# Patient Record
Sex: Female | Born: 1960 | Race: White | Hispanic: No | Marital: Married | Smoking: Former smoker
Health system: Southern US, Community
[De-identification: ages and names within clinical notes are randomized; demographics above are authoritative.]

## PROBLEM LIST (undated history)

## (undated) DIAGNOSIS — Z9889 Other specified postprocedural states: Secondary | ICD-10-CM

## (undated) DIAGNOSIS — T4145XA Adverse effect of unspecified anesthetic, initial encounter: Secondary | ICD-10-CM

## (undated) DIAGNOSIS — T8859XA Other complications of anesthesia, initial encounter: Secondary | ICD-10-CM

## (undated) DIAGNOSIS — R112 Nausea with vomiting, unspecified: Secondary | ICD-10-CM

## (undated) DIAGNOSIS — H8109 Meniere's disease, unspecified ear: Secondary | ICD-10-CM

## (undated) HISTORY — PX: ENDOMETRIAL BIOPSY: SHX622

## (undated) HISTORY — PX: WISDOM TOOTH EXTRACTION: SHX21

## (undated) HISTORY — PX: ABLATION ON ENDOMETRIOSIS: SHX5787

## (undated) HISTORY — PX: RECTO-VAGINAL FISSURE REPAIR: SHX5277

## (undated) HISTORY — PX: KNEE ARTHROSCOPY: SUR90

---

## 2001-01-18 ENCOUNTER — Other Ambulatory Visit: Admission: RE | Admit: 2001-01-18 | Discharge: 2001-01-18 | Payer: Self-pay | Admitting: Obstetrics & Gynecology

## 2003-02-15 ENCOUNTER — Other Ambulatory Visit: Admission: RE | Admit: 2003-02-15 | Discharge: 2003-02-15 | Payer: Self-pay | Admitting: Obstetrics & Gynecology

## 2004-05-06 ENCOUNTER — Other Ambulatory Visit: Admission: RE | Admit: 2004-05-06 | Discharge: 2004-05-06 | Payer: Self-pay | Admitting: Obstetrics & Gynecology

## 2005-07-22 ENCOUNTER — Other Ambulatory Visit: Admission: RE | Admit: 2005-07-22 | Discharge: 2005-07-22 | Payer: Self-pay | Admitting: Obstetrics & Gynecology

## 2010-06-15 ENCOUNTER — Encounter: Payer: Self-pay | Admitting: Obstetrics & Gynecology

## 2014-05-01 ENCOUNTER — Other Ambulatory Visit: Payer: Self-pay | Admitting: Obstetrics & Gynecology

## 2014-05-01 DIAGNOSIS — N63 Unspecified lump in unspecified breast: Secondary | ICD-10-CM

## 2014-05-09 ENCOUNTER — Ambulatory Visit
Admission: RE | Admit: 2014-05-09 | Discharge: 2014-05-09 | Disposition: A | Discharge status: Home / Self Care | Payer: 59 | Source: Ambulatory Visit | Attending: Obstetrics & Gynecology | Admitting: Obstetrics & Gynecology

## 2014-05-09 DIAGNOSIS — N63 Unspecified lump in unspecified breast: Secondary | ICD-10-CM

## 2015-04-24 ENCOUNTER — Encounter: Payer: Self-pay | Admitting: Family Medicine

## 2015-04-24 ENCOUNTER — Ambulatory Visit (INDEPENDENT_AMBULATORY_CARE_PROVIDER_SITE_OTHER): Payer: 59 | Admitting: Family Medicine

## 2015-04-24 VITALS — BP 128/81 | HR 53 | Temp 97.0°F | Ht 65.0 in | Wt 136.8 lb

## 2015-04-24 DIAGNOSIS — H6502 Acute serous otitis media, left ear: Secondary | ICD-10-CM

## 2015-04-24 NOTE — Progress Notes (Signed)
   Subjective:    Patient ID: Penny Bennett, female    DOB: [birth year], 54 y.o.   MRN: [id number]  HPI 54 year old female who complains of some fullness in her left ear. Onset was fairly sudden. There is no been no pain or fever.  There are no active problems to display for this patient.  Outpatient Encounter Prescriptions as of 04/24/2015  Medication Sig  . calcium citrate-vitamin D (CITRACAL+D) 315-200 MG-UNIT tablet Take 1 tablet by mouth 2 (two) times daily.  . Flaxseed, Linseed, (FLAX SEED OIL) 1000 MG CAPS Take 1 capsule by mouth daily.  Marland Kitchen. zolpidem (AMBIEN) 5 MG tablet Take 5 mg by mouth at bedtime as needed for sleep.   No facility-administered encounter medications on file as of 04/24/2015.      Review of Systems  Constitutional: Negative.   HENT: Positive for hearing loss.   Respiratory: Negative.   Cardiovascular: Negative.   Neurological: Negative.   Psychiatric/Behavioral: Negative.        Objective:   Physical Exam  Constitutional: She appears well-developed and well-nourished.  HENT:  Head: Normocephalic.  Left external canal is clear but the tympanic membrane is dull and there appeared to be some fluid behind eardrum. With Valsalva maneuver the eardrum does not move or pop  Cardiovascular: Normal rate and regular rhythm.   Pulmonary/Chest: Effort normal and breath sounds normal.          Assessment & Plan:  1. Acute serous otitis media of left ear, recurrence not specified No evidence of infection. Patient to use Afrin and Flonase along with Valsalva maneuver. Call or return if she develops pain.  Frederica KusterStephen M Inge Waldroup MD

## 2015-04-26 ENCOUNTER — Telehealth: Payer: Self-pay | Admitting: Family Medicine

## 2015-04-26 DIAGNOSIS — H6690 Otitis media, unspecified, unspecified ear: Secondary | ICD-10-CM

## 2015-04-26 MED ORDER — AMOXICILLIN 500 MG PO CAPS: 500.0000 mg | ORAL_CAPSULE | Freq: Two times a day (BID) | ORAL | Status: DC

## 2015-04-26 NOTE — Telephone Encounter (Signed)
Please let p tknow--sent in to [facility] [place] 500mg  take twice a day for ten days for ear infection/acute otitis media.

## 2015-04-26 NOTE — Telephone Encounter (Signed)
What symptoms is she experiencing now? Pain and fever or no improvement in fullness? Left message on work voicemail to call back with these answers. She can provide these to the switchboard operator unless she needs to speak with me directly.

## 2015-04-26 NOTE — Telephone Encounter (Signed)
Patient called back and told the switchboard that her ear is now hurting and she has a low grade fever. Dr Hyacinth MeekerMiller had asked her to call back if she developed pain and that he would send in an antibiotic.  Will you review since he is out of the office today?

## 2015-04-26 NOTE — Telephone Encounter (Signed)
Left message on voicemail that medication sent in to pharmacy.

## 2015-08-09 ENCOUNTER — Other Ambulatory Visit: Payer: Self-pay | Admitting: Otolaryngology

## 2015-08-09 DIAGNOSIS — H8102 Meniere's disease, left ear: Secondary | ICD-10-CM

## 2015-08-23 ENCOUNTER — Ambulatory Visit
Admission: RE | Admit: 2015-08-23 | Discharge: 2015-08-23 | Disposition: A | Discharge status: Home / Self Care | Payer: 59 | Source: Ambulatory Visit | Attending: Otolaryngology | Admitting: Otolaryngology

## 2015-08-23 DIAGNOSIS — H8102 Meniere's disease, left ear: Secondary | ICD-10-CM

## 2015-08-23 MED ORDER — GADOBENATE DIMEGLUMINE 529 MG/ML IV SOLN
11.0000 mL | Freq: Once | INTRAVENOUS | Status: AC | PRN
  Administered 2015-08-23: 11 mL via INTRAVENOUS

## 2016-08-07 ENCOUNTER — Encounter (INDEPENDENT_AMBULATORY_CARE_PROVIDER_SITE_OTHER): Payer: Self-pay | Admitting: Orthopaedic Surgery

## 2016-08-07 ENCOUNTER — Ambulatory Visit (INDEPENDENT_AMBULATORY_CARE_PROVIDER_SITE_OTHER): Payer: 59 | Admitting: Orthopaedic Surgery

## 2016-08-07 ENCOUNTER — Ambulatory Visit (INDEPENDENT_AMBULATORY_CARE_PROVIDER_SITE_OTHER): Payer: 59

## 2016-08-07 VITALS — BP 146/89 | HR 65 | Ht 65.0 in | Wt 140.0 lb

## 2016-08-07 DIAGNOSIS — G5761 Lesion of plantar nerve, right lower limb: Secondary | ICD-10-CM | POA: Diagnosis not present

## 2016-08-07 DIAGNOSIS — M79671 Pain in right foot: Secondary | ICD-10-CM | POA: Diagnosis not present

## 2016-08-07 NOTE — Progress Notes (Addendum)
Office Visit Note   Patient: Penny Bennett           Date of Birth: July 02, 1960           MRN: [id number] Visit Date: 08/07/2016              Requested by: No referring provider defined for this encounter. PCP: No PCP Per Patient   Assessment & Plan: Visit Diagnoses:  1. Pain in right foot   2. Morton's neuroma of right foot     Plan: Injection  performed right foot third fourth interspace common digital nerve neuroma injection. Post injection she got improvement in her pain and can walk better. She continues to have tenderness between the fourth and fifth web space likely may have neuroma there as well and she can return in one week for repeat exam.  Follow-Up Instructions: Return in about 1 week (around 08/14/2016).   Orders:  Orders Placed This Encounter  Procedures  . Foot Injection  . XR Foot Complete Right   No orders of the defined types were placed in this encounter.     Procedures: Foot Inj Date/Time: 08/07/2016 3:01 PM Performed by: Penny Bennett, Penny Dethlefs Bennett Authorized by: Penny Bennett, Penny Bennett   Indications:  Neuroma Condition: Morton's Neuroma   Needle Size:  25 G Approach:  Dorsal Medications:  0.5 mL lidocaine 1 %; 13.33 mg methylPREDNISolone acetate 40 MG/ML Patient Tolerance:  Patient tolerated the procedure well with no immediate complications     Clinical Data: No additional findings.   Subjective: Chief Complaint  Patient presents with  . Right Foot - Pain    Patient presents with right foot pain and swelling x 6 months-1 year, but getting worse over the last 3 weeks. She states that the bottom of her foot gets numb. She has no known specific injury, but is a runner and Insurance account managerdancer/instructor. She tries to rest it as much as possible for relief.     Review of Systems  Constitutional: Negative for chills and diaphoresis.  HENT: Negative for ear discharge, ear pain and nosebleeds.   Eyes: Negative for discharge and visual disturbance.  Respiratory: Negative for  cough, choking and shortness of breath.   Cardiovascular: Negative for chest pain and palpitations.  Gastrointestinal: Negative for abdominal distention and abdominal pain.  Endocrine: Negative for cold intolerance and heat intolerance.  Genitourinary: Negative for flank pain and hematuria.  Musculoskeletal:       Negative back pain. She wearing pain with heels. She gets relief with nonweightbearing. Burning pain in the toes with ambulation. Right foot only. Primarily third and fourth interspace less so fourth fifth interspace.  Skin: Negative for rash and wound.  Neurological: Negative for seizures and speech difficulty.  Hematological: Negative for adenopathy. Does not bruise/bleed easily.  Psychiatric/Behavioral: Negative for agitation and suicidal ideas.   patient has been in excellent health active and runs multiple times a week as well as shag dancing. Increased pain 3 weeks. Patient was a past smoker quit 20 years ago. Previous retrograde vaginal fistula repair in 1989 endometriosis surgery 89 with some teeth extraction 95. Knee arthroscopy meniscal repair 2002. Positive for Mnire's disease.  Objective: Vital Signs: BP (!) 146/89   Pulse 65   Ht 5\' 5"  (1.651 m)   Wt 140 lb (63.5 kg)   BMI 23.30 kg/m   Physical Exam  Constitutional: She is oriented to person, place, and time. She appears well-developed.  HENT:  Head: Normocephalic.  Right Ear: External ear normal.  Left Ear: External ear normal.  Eyes: Pupils are equal, round, and reactive to light.  Neck: No tracheal deviation present. No thyromegaly present.  Cardiovascular: Normal rate and regular rhythm.   Pulmonary/Chest: Effort normal.  Abdominal: Soft.  Musculoskeletal:  Negative straight leg raising no lumbar tenderness pelvis is level normal hip range of motion is knee exam is normal. Ligamentous exam no knee effusion minimal crepitus with knee extension. Tenderness localized primarily to the third fourth  interspace also some fourth fifth positive metatarsal squeeze test. Peroneals reflexes pulses are normal no pain with resisted extensor function. Normal capillary refill. Extensor flexor function of the toes are normal EHL anterior tib is strong peroneals are normal. No calf tenderness normal popliteal pulses.  Neurological: She is alert and oriented to person, place, and time.  Skin: Skin is warm and dry.  Psychiatric: She has a normal mood and affect. Her behavior is normal.    Ortho Exam tenderness right third fourth morbid fourth fifth intermetatarsal head region with compression positive metatarsal squeeze test. This reproduces her pain.  Specialty Comments:  No specialty comments available.  Imaging: No results found.   PMFS History: There are no active problems to display for this patient.  No past medical history on file.  No family history on file.  Past Surgical History:  Procedure Laterality Date  . ENDOMETRIAL BIOPSY     Social History   Occupational History  . Not on file.   Social History Main Topics  . Smoking status: Former Smoker    Quit date: 04/24/1995  . Smokeless tobacco: Never Used  . Alcohol use Yes     Comment: occasional  . Drug use: No  . Sexual activity: Not on file

## 2016-08-11 MED ORDER — METHYLPREDNISOLONE ACETATE 40 MG/ML IJ SUSP
13.3300 mg | INTRAMUSCULAR | Status: AC | PRN
  Administered 2016-08-07: 13.33 mg

## 2016-08-11 MED ORDER — LIDOCAINE HCL 1 % IJ SOLN
0.5000 mL | INTRAMUSCULAR | Status: AC | PRN
  Administered 2016-08-07: .5 mL

## 2016-08-14 ENCOUNTER — Ambulatory Visit (INDEPENDENT_AMBULATORY_CARE_PROVIDER_SITE_OTHER): Payer: 59 | Admitting: Orthopaedic Surgery

## 2016-08-14 ENCOUNTER — Encounter (INDEPENDENT_AMBULATORY_CARE_PROVIDER_SITE_OTHER): Payer: Self-pay | Admitting: Orthopaedic Surgery

## 2016-08-14 VITALS — BP 112/68 | HR 54 | Ht 65.0 in | Wt 140.0 lb

## 2016-08-14 DIAGNOSIS — G5761 Lesion of plantar nerve, right lower limb: Secondary | ICD-10-CM | POA: Diagnosis not present

## 2016-08-14 NOTE — Progress Notes (Signed)
Office Visit Note   Patient: Penny Bennett           Date of Birth: [birth year]   Soundra Pilon        MRN: [id number] Visit Date: 08/14/2016              Requested by: No referring provider defined for this encounter. PCP: No PCP Per Patient   Assessment & Plan: Visit Diagnoses:  1. Morton's neuroma of right foot      Good relief from previous third fourth interspace in injection. Fourth fifth interspace injection performed with good relief.  Plan: Patient returns and had an Morton's neuroma injection third fourth interspace. This portion is better but now she is having more pain at the fourth fifth interspace. Pain with weightbearing.  Follow-Up Instructions: Return if symptoms worsen or fail to improve.   Orders:  No orders of the defined types were placed in this encounter.  No orders of the defined types were placed in this encounter.     Procedures: Foot Inj Date/Time: 08/17/2016 9:13 PM Performed by: Eldred MangesYATES, Marcellus Pulliam C Authorized by: Annell GreeningYATES, Larson Limones C   Condition: Morton's Neuroma   Location:  R foot Needle Size:  25 G Medications:  0.33 mL lidocaine 1 %; 0.33 mL bupivacaine 0.25 %; 13.33 mg methylPREDNISolone acetate 40 MG/ML     Clinical Data: No additional findings.   Subjective: Chief Complaint  Patient presents with  . Right Foot - Pain    Patient returns for one week follow up s/p injection of morton's neuroma right 3rd and 4th interspace right foot. She states that she still has pain when she pulls her toes back. She is not as tender to touch in this area, but is still tender between the 4th and 5th toes.     Review of Systems 14 point review of systems updated unchanged other than as above concerning her foot. So her pain is improved. She now has more pain laterally.   Objective: Vital Signs: BP 112/68   Pulse (!) 54   Ht 5\' 5"  (1.651 m)   Wt 140 lb (63.5 kg)   BMI 23.30 kg/m   Physical Exam  Constitutional: She is oriented to person, place, and time. She  appears well-developed.  HENT:  Head: Normocephalic.  Right Ear: External ear normal.  Left Ear: External ear normal.  Eyes: Pupils are equal, round, and reactive to light.  Neck: No tracheal deviation present. No thyromegaly present.  Cardiovascular: Normal rate.   Pulmonary/Chest: Effort normal.  Abdominal: Soft.  Musculoskeletal:  Slight forefoot edema. Fourth fifth interspace is tender. Slight ecchymosis between the third fourth interdigital space from previous injection. Positive squeeze test for pain between the fourth and fifth metatarsal heads.  Neurological: She is alert and oriented to person, place, and time.  Skin: Skin is warm and dry.  Psychiatric: She has a normal mood and affect. Her behavior is normal.    Ortho Exam  Specialty Comments:  No specialty comments available.  Imaging: No results found.   PMFS History: There are no active problems to display for this patient.  No past medical history on file.  No family history on file.  Past Surgical History:  Procedure Laterality Date  . ENDOMETRIAL BIOPSY     Social History   Occupational History  . Not on file.   Social History Main Topics  . Smoking status: Former Smoker    Quit date: 04/24/1995  . Smokeless tobacco: Never Used  . Alcohol use  Yes     Comment: occasional  . Drug use: No  . Sexual activity: Not on file

## 2016-08-17 DIAGNOSIS — G5761 Lesion of plantar nerve, right lower limb: Secondary | ICD-10-CM | POA: Diagnosis not present

## 2016-08-17 MED ORDER — METHYLPREDNISOLONE ACETATE 40 MG/ML IJ SUSP
13.3300 mg | INTRAMUSCULAR | Status: AC | PRN
  Administered 2016-08-17: 13.33 mg

## 2016-08-17 MED ORDER — BUPIVACAINE HCL 0.25 % IJ SOLN
0.3300 mL | INTRAMUSCULAR | Status: AC | PRN
  Administered 2016-08-17: .33 mL

## 2016-08-17 MED ORDER — LIDOCAINE HCL 1 % IJ SOLN
0.3300 mL | INTRAMUSCULAR | Status: AC | PRN
  Administered 2016-08-17: .33 mL

## 2017-03-06 IMAGING — MR MR HEAD WO/W CM
10 of 11 series · 32 of 48 positions shown · IV contrast (multihance)
Comparison: None.

CLINICAL DATA: LEFT ear hearing loss. Tinnitus. Suspected Meniere's
disease on the LEFT. Evaluate for possible retrocochlear lesion.

EXAM:
MRI HEAD WITHOUT AND WITH CONTRAST
TECHNIQUE: Multiplanar, multiecho pulse sequences of the brain and surrounding
structures were obtained without and with intravenous contrast.
CONTRAST:  11mL MULTIHANCE GADOBENATE DIMEGLUMINE 529 MG/ML IV SOLN

[Series 2: T1 · sagittal · 5.0mm · 0.45mm/px · 4 of 22 slices shown (1 of 4)]
[im 1/22]
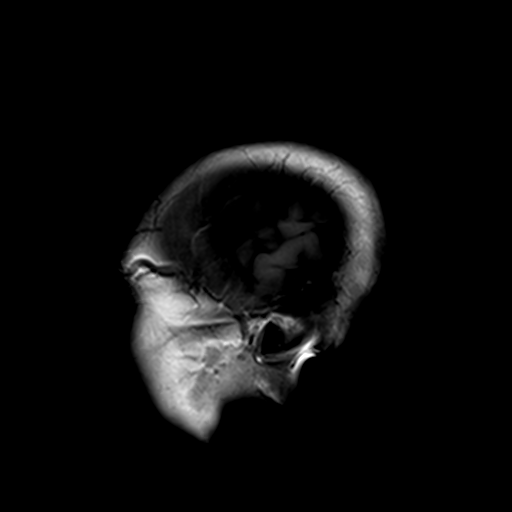
[im 8/22]
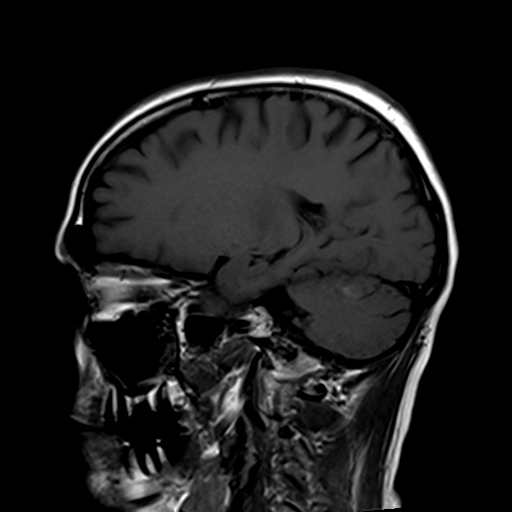
[im 15/22]
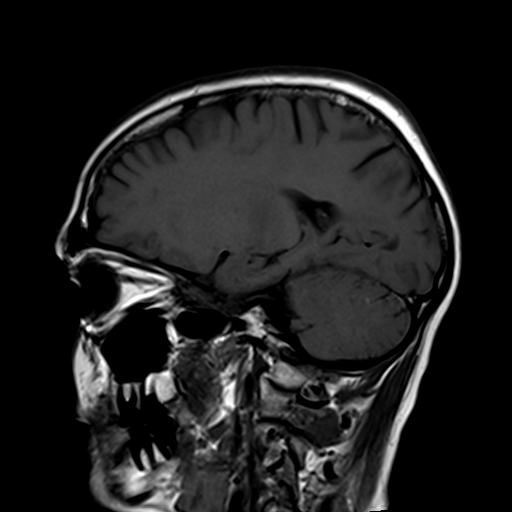
[im 22/22]
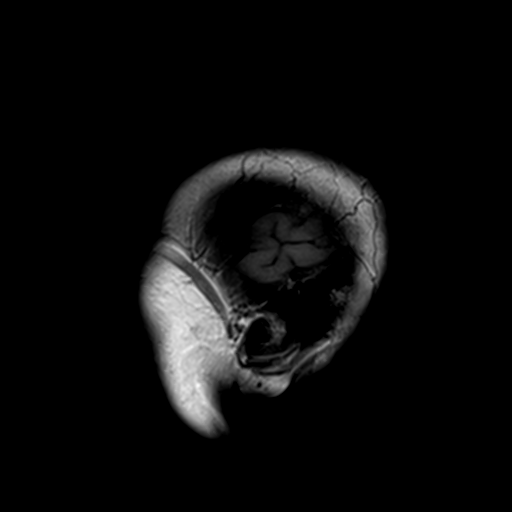

[Series 3: ep2d_diff_(id)_trace · axial · 3.0mm · 1.80mm/px · z∈[-71,+76]mm · 8 of 98 slices shown]
[im 1/98]
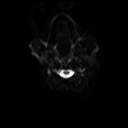
[im 17/98]
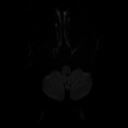
[im 33/98]
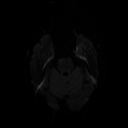
[im 41/98]
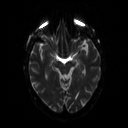
[im 57/98]
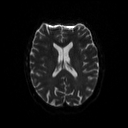
[im 65/98]
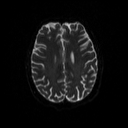
[im 81/98]
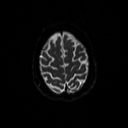
[im 98/98]
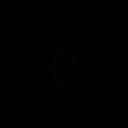

[Series 4: ep2d_diff_(id)_trace_adc · axial · 3.0mm · 1.80mm/px · z∈[-71,+76]mm · 6 of 46 slices shown]
[im 1/46]
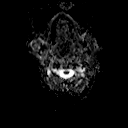
[im 10/46]
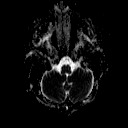
[im 19/46]
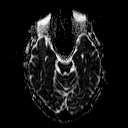
[im 28/46]
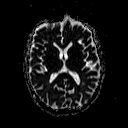
[im 37/46]
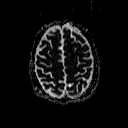
[im 46/46]
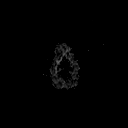

[Series 5: T2 · axial · 5.0mm · 0.45mm/px · z∈[-55,+70]mm · 3 of 21 slices shown]
[im 1/21]
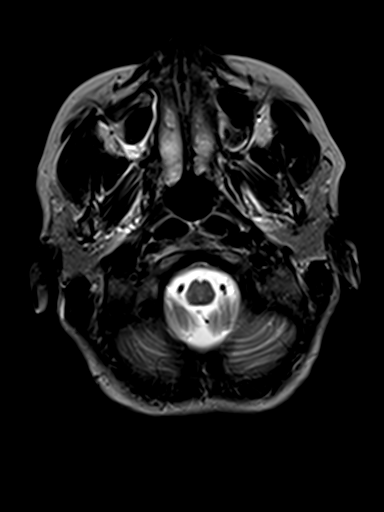
[im 11/21]
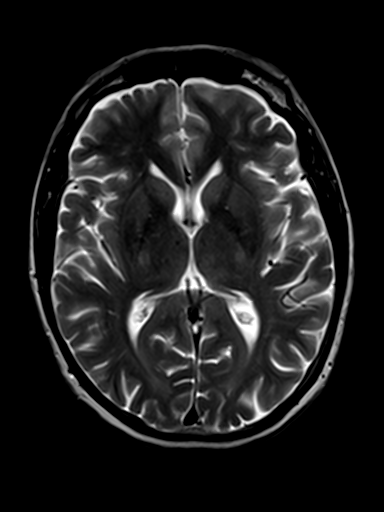
[im 21/21]
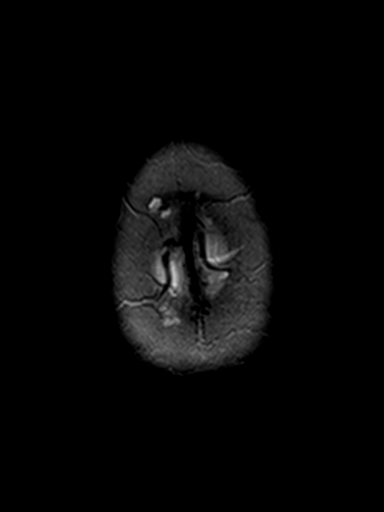

[Series 6: FLAIR · axial · 5.0mm · 0.45mm/px · z∈[-56,+69]mm · 3 of 21 slices shown]
[im 1/21]
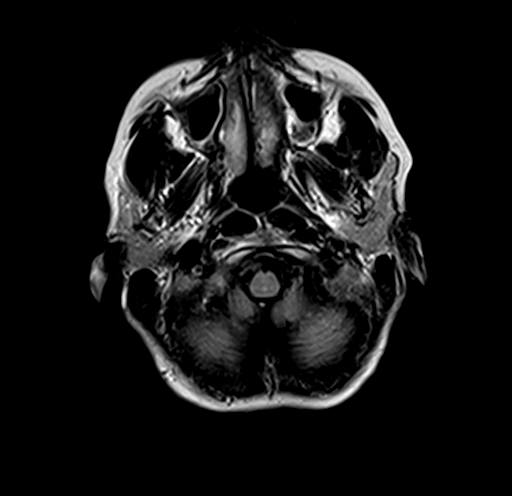
[im 11/21]
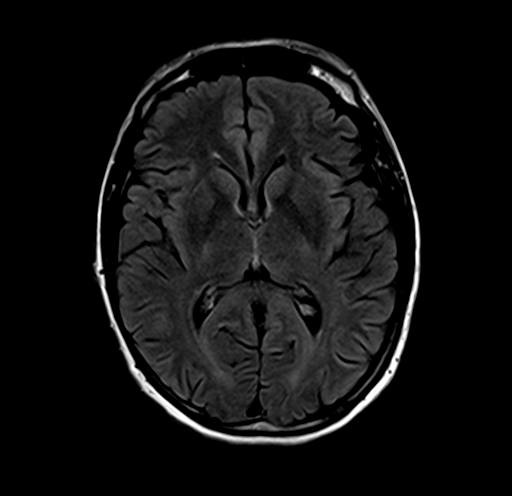
[im 21/21]
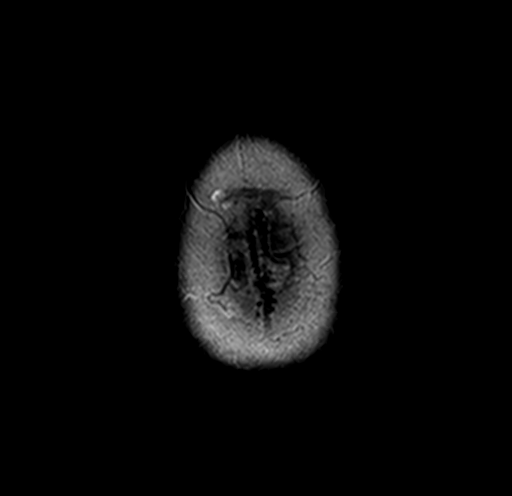

[Series 7: T1 · coronal · 3.0mm · 0.35mm/px · 1 of 11 slices shown (2 of 4)]
[im 1/11]
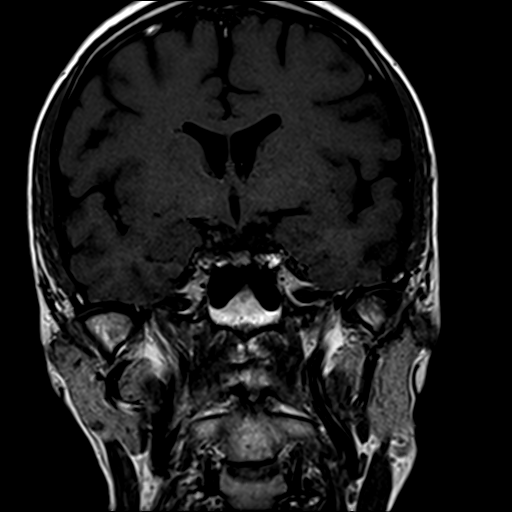

[Series 8: T1 · axial · 3.0mm · 0.35mm/px · 1 of 11 slices shown (3 of 4)]
[im 1/11]
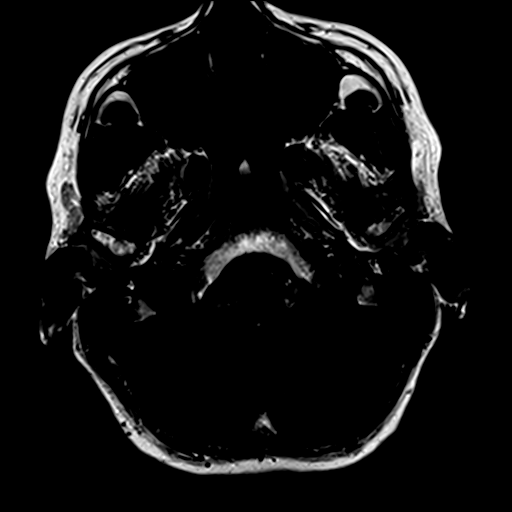

[Series 9: bSSFP · axial · 0.7mm · 0.28mm/px · z∈[-37,-20]mm · 4 of 44 slices shown]
[im 1/44]
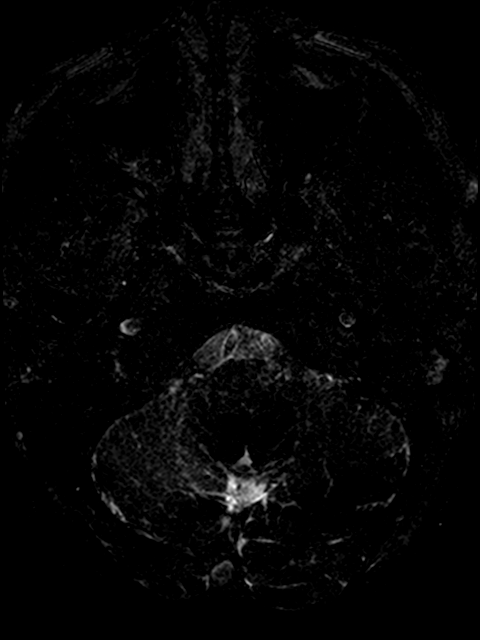
[im 9/44]
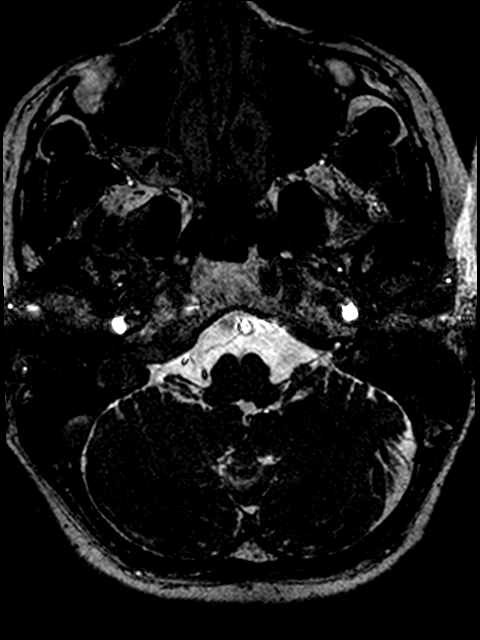
[im 18/44]
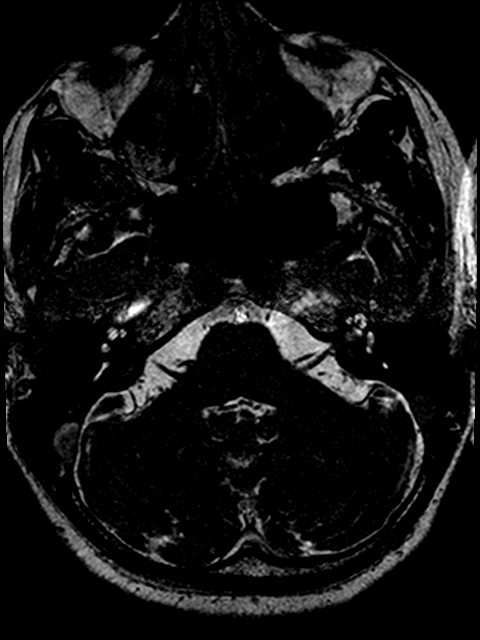
[im 26/44]
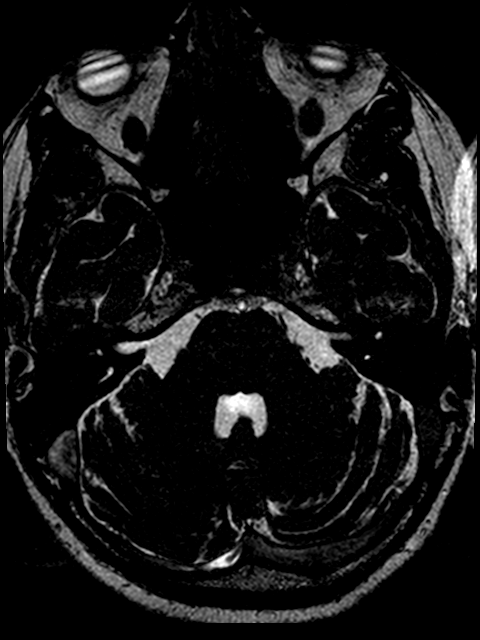

[Series 10: T1 · coronal · 3.0mm · 0.35mm/px · 1 of 11 slices shown (4 of 4)]
[im 1/11]
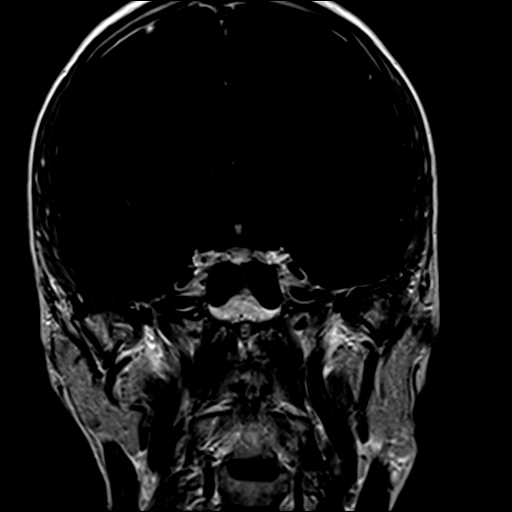

[Series 11: T1 post-contrast · axial · 3.0mm · 0.35mm/px · 1 of 11 slices shown]
[im 1/11]
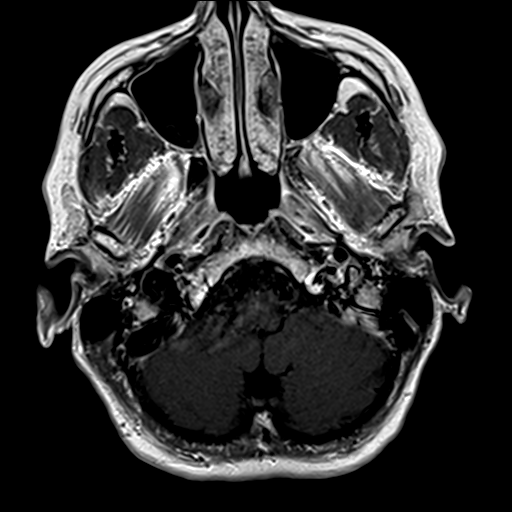

[32 of 48 positions shown; findings below may reference images not displayed]

FINDINGS: No evidence for acute infarction, hemorrhage, mass lesion,
hydrocephalus, or extra-axial fluid. Slight premature atrophy for
age. No white matter disease of significance. No midline
abnormality.

Thin-section imaging was obtained through the posterior fossa. No
evidence for vestibular schwannoma or posterior fossa mass. No
temporal bone inflammatory process. No vascular loop. Major dural
venous sinuses are patent.

Post infusion imaging through the entire head demonstrates an
incidental venous angioma of the LEFT vermis. No other significant
findings on post infusion imaging.

Negative orbits. Acute RIGHT maxillary sinus disease. No temporal
bone abnormalities. Negative scalp soft tissues.
IMPRESSION: Slight premature atrophy for age.  No white matter disease.

No retrocochlear lesion or temporal bone enhancement.

## 2017-04-09 ENCOUNTER — Encounter (INDEPENDENT_AMBULATORY_CARE_PROVIDER_SITE_OTHER): Payer: Self-pay | Admitting: Orthopaedic Surgery

## 2017-04-09 ENCOUNTER — Ambulatory Visit (INDEPENDENT_AMBULATORY_CARE_PROVIDER_SITE_OTHER): Payer: 59 | Admitting: Orthopaedic Surgery

## 2017-04-09 VITALS — BP 120/60 | HR 60

## 2017-04-09 DIAGNOSIS — G5761 Lesion of plantar nerve, right lower limb: Secondary | ICD-10-CM

## 2017-04-09 MED ORDER — BUPIVACAINE HCL 0.25 % IJ SOLN
0.3300 mL | INTRAMUSCULAR | Status: AC | PRN
  Administered 2017-04-09: .33 mL

## 2017-04-09 MED ORDER — LIDOCAINE HCL 1 % IJ SOLN
0.3300 mL | INTRAMUSCULAR | Status: AC | PRN
  Administered 2017-04-09: .33 mL

## 2017-04-09 MED ORDER — METHYLPREDNISOLONE ACETATE 40 MG/ML IJ SUSP
13.3300 mg | INTRAMUSCULAR | Status: AC | PRN
  Administered 2017-04-09: 13.33 mg

## 2017-04-09 NOTE — Progress Notes (Signed)
Office Visit Note   Patient: Penny Bennett           Date of Birth: 08-10-1960           MRN: [id number] Visit Date: 04/09/2017              Requested by: No referring provider defined for this encounter. PCP: Patient, No Pcp Per   Assessment & Plan: Visit Diagnoses:  1. Morton's neuroma of right foot      2nd time injection done today.   Plan: Martin's neuroma injected between the third and fourth toe where she is maximally tender.  She has some tenderness between the second and third.  No tenderness between the fourth and fifth which had previous injection.  No symptoms on the opposite left foot.  Follow-Up Instructions: No Follow-up on file.   Orders:  Orders Placed This Encounter  Procedures  . Foot Inj   No orders of the defined types were placed in this encounter.     Procedures: Foot Inj Date/Time: 04/09/2017 3:34 PM Performed by: Eldred MangesYates, Tory Septer C, MD Authorized by: Eldred MangesYates, Jannette Cotham C, MD   Indications:  Neuroma Condition: Morton's Neuroma   Location:  R foot Needle Size:  25 G Approach:  Dorsal Medications:  0.33 mL lidocaine 1 %; 0.33 mL bupivacaine 0.25 %; 13.33 mg methylPREDNISolone acetate 40 MG/ML     Clinical Data: No additional findings.   Subjective: Chief Complaint  Patient presents with  . Right Foot - Pain    HPI 56 year old female returns she likes to dance and has had recurrent problems with Morton's neuroma pain with shoe wearing.  She tries to get shoes that have a wide forefoot.  Increased pain with heels she does well during the summer in sandals.  She had a previous injection in March and the symptoms have now increased to the point where she requests repeat injection.  Review of Systems view of systems updated unchanged from 08/07/2016 office visit.   Objective: Vital Signs: BP 120/60   Pulse 60   Physical Exam  Constitutional: She is oriented to person, place, and time. She appears well-developed.  HENT:  Head: Normocephalic.    Right Ear: External ear normal.  Left Ear: External ear normal.  Eyes: Pupils are equal, round, and reactive to light.  Neck: No tracheal deviation present. No thyromegaly present.  Cardiovascular: Normal rate.  Pulmonary/Chest: Effort normal.  Abdominal: Soft.  Neurological: She is alert and oriented to person, place, and time.  Skin: Skin is warm and dry.  Psychiatric: She has a normal mood and affect. Her behavior is normal.    Ortho Exam on ankle range of motion no sciatic notch tenderness negative straight leg raising 90 degrees no pain with internal/external rotation of her hips.  Reflexes are 2+ and symmetrical normal pulses normal capillary refill.  Tenderness between the third and fourth metatarsal heads reproduce some of her symptoms she also has some tenderness between the second and third but not as severe.  No plantar foot lesions.  Posterior tibial tendon is normal tarsal canal is nontender with pressure.  Specialty Comments:  No specialty comments available.  Imaging: No results found.   PMFS History: Patient Active Problem List   Diagnosis Date Noted  . Morton's neuroma of right foot 08/17/2016   No past medical history on file.  No family history on file.  Past Surgical History:  Procedure Laterality Date  . ENDOMETRIAL BIOPSY     Social History  Occupational History  . Not on file  Tobacco Use  . Smoking status: Former Smoker    Last attempt to quit: 04/24/1995    Years since quitting: 21.9  . Smokeless tobacco: Never Used  Substance and Sexual Activity  . Alcohol use: Yes    Comment: occasional  . Drug use: No  . Sexual activity: Not on file

## 2017-04-15 ENCOUNTER — Ambulatory Visit: Payer: 59 | Admitting: Family Medicine

## 2017-04-15 ENCOUNTER — Encounter: Payer: Self-pay | Admitting: Family Medicine

## 2017-04-15 VITALS — BP 134/86 | HR 76 | Temp 97.4°F | Ht 65.0 in | Wt 144.0 lb

## 2017-04-15 DIAGNOSIS — J0100 Acute maxillary sinusitis, unspecified: Secondary | ICD-10-CM | POA: Diagnosis not present

## 2017-04-15 MED ORDER — AMOXICILLIN-POT CLAVULANATE 875-125 MG PO TABS: 1.0000 | ORAL_TABLET | Freq: Two times a day (BID) | ORAL | 0 refills | Status: DC

## 2017-04-15 MED ORDER — PSEUDOEPHEDRINE-GUAIFENESIN ER 60-600 MG PO TB12
1.0000 | ORAL_TABLET | Freq: Two times a day (BID) | ORAL | 0 refills | Status: DC
Start: 2017-04-15 — End: 2017-09-07

## 2017-04-15 NOTE — Progress Notes (Signed)
Chief Complaint  Patient presents with  . Sinus Problem    pt here today c/o sinus pressure and sinus drainage which she believes is a sinus infection.    HPI  Patient presents today for Patient presents with upper respiratory congestion. Rhinorrhea that is frequently purulent. There is moderate sore throat. Cheeks hurt, upper teeth hurt. Patient reports coughing frequently as well.  green sputum noted. There is no fever, chills or sweats. The patient denies being short of breath. Onset was 3-5 days ago. Gradually worsening. Tried OTCs without improvement.  PMH: Smoking status noted ROS: Per HPI  Objective: BP 134/86   Pulse 76   Temp (!) 97.4 F (36.3 C) (Oral)   Ht 5\' 5"  (1.651 m)   Wt 144 lb (65.3 kg)   BMI 23.96 kg/m  Gen: NAD, alert, cooperative with exam HEENT: NCAT, Nasal passages swollen, red Max sinuses tender. R>L CV: RRR, good S1/S2, no murmur Resp: Bronchitis changes with scattered wheezes, non-labored Ext: No edema, warm Neuro: Alert and oriented, No gross deficits  Assessment and plan:  1. Acute maxillary sinusitis, recurrence not specified     Meds ordered this encounter  Medications  . amoxicillin-clavulanate (AUGMENTIN) 875-125 MG tablet    Sig: Take 1 tablet by mouth 2 (two) times daily.    Dispense:  20 tablet    Refill:  0  . pseudoephedrine-guaifenesin (MUCINEX D) 60-600 MG 12 hr tablet    Sig: Take 1 tablet by mouth every 12 (twelve) hours.    Dispense:  20 tablet    Refill:  0    No orders of the defined types were placed in this encounter.   Follow up as needed.  Mechele ClaudeWarren Tarika Mckethan, MD

## 2017-09-07 ENCOUNTER — Ambulatory Visit: Payer: 59 | Admitting: Family

## 2017-09-07 ENCOUNTER — Encounter: Payer: Self-pay | Admitting: Family

## 2017-09-07 VITALS — BP 142/78 | HR 53 | Temp 96.8°F | Ht 65.0 in | Wt 136.8 lb

## 2017-09-07 DIAGNOSIS — W19XXXA Unspecified fall, initial encounter: Secondary | ICD-10-CM | POA: Diagnosis not present

## 2017-09-07 DIAGNOSIS — S81812A Laceration without foreign body, left lower leg, initial encounter: Secondary | ICD-10-CM

## 2017-09-07 DIAGNOSIS — Y92009 Unspecified place in unspecified non-institutional (private) residence as the place of occurrence of the external cause: Secondary | ICD-10-CM

## 2017-09-07 MED ORDER — CEPHALEXIN 500 MG PO CAPS: 500.0000 mg | ORAL_CAPSULE | Freq: Two times a day (BID) | ORAL | 0 refills | Status: DC

## 2017-09-07 NOTE — Patient Instructions (Signed)

## 2017-09-07 NOTE — Progress Notes (Signed)
   Subjective:    Patient ID: Penny Bennett, female    DOB: 02/07/1961, 57 y.o.   MRN: [id number]  Pt presents to the office today for a fall on 09/05/17 from trying to hang a hanging basket. States her scraped her left lower leg on a "planter". Reports bloody discharge and constant, achy 6-7 out of 10. PT has applied antibiotic ointment with mild relief. TDAP up to date.  Laceration   Her tetanus status is UTD.  Fall  The accident occurred 2 days ago. She fell from a height of 3 to 5 ft.      Review of Systems  Skin: Positive for wound.  All other systems reviewed and are negative.      Objective:   Physical Exam  Constitutional: She is oriented to person, place, and time. She appears well-developed and well-nourished. No distress.  HENT:  Head: Normocephalic.  Cardiovascular: Normal rate, regular rhythm, normal heart sounds and intact distal pulses.  No murmur heard. Pulmonary/Chest: Effort normal and breath sounds normal. No respiratory distress. She has no wheezes.  Abdominal: Soft. Bowel sounds are normal. She exhibits no distension. There is no tenderness.  Musculoskeletal: Normal range of motion. She exhibits no edema or tenderness.  Neurological: She is alert and oriented to person, place, and time.  Skin: Skin is warm and dry. Laceration (2.5cmX2.5cm laceration on left lower leg) noted.  Psychiatric: She has a normal mood and affect. Her behavior is normal. Judgment and thought content normal.  Vitals reviewed.       BP (!) 142/78   Pulse (!) 53   Temp (!) 96.8 F (36 C) (Oral)   Ht 5\' 5"  (1.651 m)   Wt 136 lb 12.8 oz (62.1 kg)   BMI 22.76 kg/m      Assessment & Plan:  1. Laceration of left lower extremity, initial encounter Change dressing BID Report any changes in redness, discharge, or pain RTO if does not improve or worsens - cephALEXin (KEFLEX) 500 MG capsule; Take 1 capsule (500 mg total) by mouth 2 (two) times daily.  Dispense: 14 capsule; Refill:  0  2. Fall in home, initial encounter    Jannifer Rodneyhristy Hawks, FNP

## 2017-12-10 ENCOUNTER — Ambulatory Visit (INDEPENDENT_AMBULATORY_CARE_PROVIDER_SITE_OTHER): Payer: 59 | Admitting: Orthopaedic Surgery

## 2017-12-10 ENCOUNTER — Encounter (INDEPENDENT_AMBULATORY_CARE_PROVIDER_SITE_OTHER): Payer: Self-pay | Admitting: Orthopaedic Surgery

## 2017-12-10 DIAGNOSIS — G5761 Lesion of plantar nerve, right lower limb: Secondary | ICD-10-CM

## 2017-12-10 MED ORDER — METHYLPREDNISOLONE ACETATE 40 MG/ML IJ SUSP
40.0000 mg | INTRAMUSCULAR | Status: AC | PRN
  Administered 2017-12-10: 40 mg

## 2017-12-10 MED ORDER — LIDOCAINE HCL 1 % IJ SOLN
0.3300 mL | INTRAMUSCULAR | Status: AC | PRN
  Administered 2017-12-10: .33 mL

## 2017-12-10 MED ORDER — BUPIVACAINE HCL 0.25 % IJ SOLN
0.3300 mL | INTRAMUSCULAR | Status: AC | PRN
Start: 2017-12-10 — End: 2017-12-10
  Administered 2017-12-10: .33 mL

## 2017-12-10 NOTE — Progress Notes (Signed)
   Office Visit Note   Patient: Penny Bennett           Date of Birth: 1960/09/14           MRN: [id number] Visit Date: 12/10/2017              Requested by: Mechele ClaudeStacks, [place], MD [address] [place], [place] [id number] PCP: Mechele ClaudeStacks, Warren, MD   Assessment & Plan: Visit Diagnoses:  1. Morton's neuroma of right foot     Plan: Injection performed with good relief between second third metatarsal head.  Follow-up if she has persistent symptoms.  Follow-Up Instructions: No follow-ups on file.   Orders:  No orders of the defined types were placed in this encounter.  No orders of the defined types were placed in this encounter.     Procedures: Foot Inj Date/Time: 12/10/2017 2:34 PM Performed by: Eldred MangesYates, Mark C, MD Authorized by: Eldred MangesYates, Mark C, MD   Condition: Morton's Neuroma   Location:  R foot Prep: patient was prepped and draped in usual sterile fashion   Needle Size:  22 G Approach:  Anterior Medications:  0.33 mL lidocaine 1 %; 0.33 mL bupivacaine 0.25 %; 40 mg methylPREDNISolone acetate 40 MG/ML     Clinical Data: No additional findings.   Subjective: Chief Complaint  Patient presents with  . Right Foot - Follow-up    HPI 57 year old female returns with recurrent right foot Morton's neuroma between the second third.  She is requesting a repeat injection last was done last year she did well for many number of months and then she has had gradual recurrence despite appropriate shoewear.  She denies any falls or injuries no associated back problems.  Review of Systems 14 point update unchanged from 08/08/2006 other than as mentioned in HPI.   Objective: Vital Signs: There were no vitals taken for this visit.  Physical Exam  Constitutional: She is oriented to person, place, and time. She appears well-developed.  HENT:  Head: Normocephalic.  Right Ear: External ear normal.  Left Ear: External ear normal.  Eyes: Pupils are equal, round, and reactive to light.  Neck: No  tracheal deviation present. No thyromegaly present.  Cardiovascular: Normal rate.  Pulmonary/Chest: Effort normal.  Abdominal: Soft.  Neurological: She is alert and oriented to person, place, and time.  Skin: Skin is warm and dry.  Psychiatric: She has a normal mood and affect. Her behavior is normal.    Ortho Exam positive metatarsal compression test on the right tenderness between the second third metatarsal heads.  Good capillary refill.  No sciatic notch tenderness negative straight leg raising to 90 degrees.  Specialty Comments:  No specialty comments available.  Imaging: No results found.   PMFS History: Patient Active Problem List   Diagnosis Date Noted  . Morton's neuroma of right foot 08/17/2016   History reviewed. No pertinent past medical history.  History reviewed. No pertinent family history.  Past Surgical History:  Procedure Laterality Date  . ENDOMETRIAL BIOPSY     Social History   Occupational History  . Not on file  Tobacco Use  . Smoking status: Former Smoker    Last attempt to quit: 04/24/1995    Years since quitting: 22.6  . Smokeless tobacco: Never Used  Substance and Sexual Activity  . Alcohol use: Yes    Comment: occasional  . Drug use: No  . Sexual activity: Not on file

## 2018-04-20 ENCOUNTER — Telehealth (INDEPENDENT_AMBULATORY_CARE_PROVIDER_SITE_OTHER): Payer: Self-pay | Admitting: Orthopaedic Surgery

## 2018-04-20 NOTE — Telephone Encounter (Signed)
Ov notes faxed to Dr. Darden Datesrake's office [phone]

## 2018-06-23 ENCOUNTER — Encounter (HOSPITAL_BASED_OUTPATIENT_CLINIC_OR_DEPARTMENT_OTHER): Payer: Self-pay | Admitting: *Deleted

## 2018-06-23 ENCOUNTER — Other Ambulatory Visit: Payer: Self-pay

## 2018-06-28 ENCOUNTER — Other Ambulatory Visit (HOSPITAL_COMMUNITY): Payer: Self-pay | Admitting: Orthopedic Surgery

## 2018-06-30 NOTE — Anesthesia Preprocedure Evaluation (Addendum)
Anesthesia Evaluation  Patient identified by MRN, date of birth, ID band Patient awake    Reviewed: Allergy & Precautions, NPO status , Patient's Chart, lab work & pertinent test results  History of Anesthesia Complications (+) PONV  Airway Mallampati: I  TM Distance: >3 FB Neck ROM: Full    Dental no notable dental hx. (+) Teeth Intact, Dental Advisory Given   Pulmonary neg pulmonary ROS, former smoker,    Pulmonary exam normal breath sounds clear to auscultation       Cardiovascular Exercise Tolerance: Good negative cardio ROS Normal cardiovascular exam Rhythm:Regular Rate:Normal     Neuro/Psych negative psych ROS   GI/Hepatic   Endo/Other    Renal/GU      Musculoskeletal   Abdominal   Peds  Hematology negative hematology ROS (+)   Anesthesia Other Findings   Reproductive/Obstetrics                            Anesthesia Physical Anesthesia Plan  ASA: I  Anesthesia Plan: MAC and Regional   Post-op Pain Management:  Regional for Post-op pain   Induction:   PONV Risk Score and Plan: 3 and Treatment may vary due to age or medical condition, Ondansetron, Dexamethasone, TIVA and Midazolam  Airway Management Planned: Nasal Cannula and Natural Airway  Additional Equipment:   Intra-op Plan:   Post-operative Plan:   Informed Consent: I have reviewed the patients History and Physical, chart, labs and discussed the procedure including the risks, benefits and alternatives for the proposed anesthesia with the patient or authorized representative who has indicated his/her understanding and acceptance.     Dental advisory given  Plan Discussed with: CRNA  Anesthesia Plan Comments: (R Foot osteotomy mortons neuroma w pop block  MAC)       Anesthesia Quick Evaluation

## 2018-07-01 ENCOUNTER — Ambulatory Visit (HOSPITAL_BASED_OUTPATIENT_CLINIC_OR_DEPARTMENT_OTHER)
Admission: RE | Admit: 2018-07-01 | Discharge: 2018-07-01 | Disposition: A | Discharge status: Home / Self Care | Payer: 59 | Attending: Orthopedic Surgery | Admitting: Orthopedic Surgery

## 2018-07-01 ENCOUNTER — Ambulatory Visit (HOSPITAL_BASED_OUTPATIENT_CLINIC_OR_DEPARTMENT_OTHER): Payer: 59 | Admitting: Anesthesiology

## 2018-07-01 ENCOUNTER — Encounter (HOSPITAL_BASED_OUTPATIENT_CLINIC_OR_DEPARTMENT_OTHER)
Admission: RE | Disposition: A | Discharge status: Home / Self Care | Payer: Self-pay | Source: Home / Self Care | Attending: Orthopedic Surgery

## 2018-07-01 ENCOUNTER — Encounter (HOSPITAL_BASED_OUTPATIENT_CLINIC_OR_DEPARTMENT_OTHER): Payer: Self-pay | Admitting: Certified Registered"

## 2018-07-01 ENCOUNTER — Other Ambulatory Visit: Payer: Self-pay

## 2018-07-01 DIAGNOSIS — Z79899 Other long term (current) drug therapy: Secondary | ICD-10-CM | POA: Insufficient documentation

## 2018-07-01 DIAGNOSIS — G5761 Lesion of plantar nerve, right lower limb: Secondary | ICD-10-CM | POA: Diagnosis not present

## 2018-07-01 DIAGNOSIS — M25374 Other instability, right foot: Secondary | ICD-10-CM | POA: Insufficient documentation

## 2018-07-01 DIAGNOSIS — G5760 Lesion of plantar nerve, unspecified lower limb: Secondary | ICD-10-CM | POA: Diagnosis present

## 2018-07-01 DIAGNOSIS — Z87891 Personal history of nicotine dependence: Secondary | ICD-10-CM | POA: Diagnosis not present

## 2018-07-01 DIAGNOSIS — Z882 Allergy status to sulfonamides status: Secondary | ICD-10-CM | POA: Insufficient documentation

## 2018-07-01 HISTORY — DX: Other specified postprocedural states: Z98.890

## 2018-07-01 HISTORY — DX: Other specified postprocedural states: R11.2

## 2018-07-01 HISTORY — PX: WEIL OSTEOTOMY: SHX5044

## 2018-07-01 HISTORY — DX: Meniere's disease, unspecified ear: H81.09

## 2018-07-01 HISTORY — DX: Other complications of anesthesia, initial encounter: T88.59XA

## 2018-07-01 HISTORY — DX: Adverse effect of unspecified anesthetic, initial encounter: T41.45XA

## 2018-07-01 HISTORY — PX: EXCISION MORTON'S NEUROMA: SHX5013

## 2018-07-01 SURGERY — OSTEOTOMY, WEIL
Anesthesia: Monitor Anesthesia Care | Site: Foot | Laterality: Right

## 2018-07-01 MED ORDER — GABAPENTIN 300 MG PO CAPS
300.0000 mg | ORAL_CAPSULE | Freq: Once | ORAL | Status: AC
  Administered 2018-07-01: 300 mg via ORAL

## 2018-07-01 MED ORDER — FENTANYL CITRATE (PF) 100 MCG/2ML IJ SOLN
INTRAMUSCULAR | Status: AC
  Filled 2018-07-01: qty 2

## 2018-07-01 MED ORDER — LIDOCAINE HCL (CARDIAC) PF 100 MG/5ML IV SOSY
PREFILLED_SYRINGE | INTRAVENOUS | Status: DC | PRN
  Administered 2018-07-01: 30 mg via INTRAVENOUS

## 2018-07-01 MED ORDER — DOCUSATE SODIUM 100 MG PO CAPS: 100.0000 mg | ORAL_CAPSULE | Freq: Two times a day (BID) | ORAL | 0 refills | Status: AC

## 2018-07-01 MED ORDER — CELECOXIB 200 MG PO CAPS
ORAL_CAPSULE | ORAL | Status: AC
  Filled 2018-07-01: qty 1

## 2018-07-01 MED ORDER — GABAPENTIN 300 MG PO CAPS
ORAL_CAPSULE | ORAL | Status: AC
  Filled 2018-07-01: qty 1

## 2018-07-01 MED ORDER — OXYCODONE HCL 5 MG/5ML PO SOLN: 5.0000 mg | Freq: Once | ORAL | Status: DC | PRN

## 2018-07-01 MED ORDER — SCOPOLAMINE 1 MG/3DAYS TD PT72: 1.0000 | MEDICATED_PATCH | TRANSDERMAL | Status: DC

## 2018-07-01 MED ORDER — BUPIVACAINE HCL (PF) 0.5 % IJ SOLN
INTRAMUSCULAR | Status: AC
  Filled 2018-07-01: qty 30

## 2018-07-01 MED ORDER — CHLORHEXIDINE GLUCONATE 4 % EX LIQD: 60.0000 mL | Freq: Once | CUTANEOUS | Status: DC

## 2018-07-01 MED ORDER — SCOPOLAMINE 1 MG/3DAYS TD PT72: 1.0000 | MEDICATED_PATCH | Freq: Once | TRANSDERMAL | Status: DC | PRN

## 2018-07-01 MED ORDER — MIDAZOLAM HCL 2 MG/2ML IJ SOLN
INTRAMUSCULAR | Status: AC
  Filled 2018-07-01: qty 2

## 2018-07-01 MED ORDER — OXYCODONE HCL 5 MG PO TABS: 5.0000 mg | ORAL_TABLET | Freq: Once | ORAL | Status: DC | PRN

## 2018-07-01 MED ORDER — CELECOXIB 200 MG PO CAPS
200.0000 mg | ORAL_CAPSULE | Freq: Once | ORAL | Status: AC
  Administered 2018-07-01: 200 mg via ORAL

## 2018-07-01 MED ORDER — ACETAMINOPHEN 500 MG PO TABS
ORAL_TABLET | ORAL | Status: AC
  Filled 2018-07-01: qty 2

## 2018-07-01 MED ORDER — FENTANYL CITRATE (PF) 100 MCG/2ML IJ SOLN: 25.0000 ug | INTRAMUSCULAR | Status: DC | PRN

## 2018-07-01 MED ORDER — BUPIVACAINE HCL (PF) 0.25 % IJ SOLN
INTRAMUSCULAR | Status: AC
  Filled 2018-07-01: qty 30

## 2018-07-01 MED ORDER — SODIUM CHLORIDE 0.9 % IV SOLN: INTRAVENOUS | Status: DC

## 2018-07-01 MED ORDER — CEFAZOLIN SODIUM-DEXTROSE 2-4 GM/100ML-% IV SOLN
2.0000 g | INTRAVENOUS | Status: AC
  Administered 2018-07-01: 2 g via INTRAVENOUS

## 2018-07-01 MED ORDER — OXYCODONE HCL 5 MG PO TABS: 5.0000 mg | ORAL_TABLET | Freq: Four times a day (QID) | ORAL | 0 refills | Status: AC | PRN

## 2018-07-01 MED ORDER — MEPERIDINE HCL 25 MG/ML IJ SOLN: 6.2500 mg | INTRAMUSCULAR | Status: DC | PRN

## 2018-07-01 MED ORDER — CEFAZOLIN SODIUM-DEXTROSE 2-4 GM/100ML-% IV SOLN
INTRAVENOUS | Status: AC
  Filled 2018-07-01: qty 100

## 2018-07-01 MED ORDER — DEXAMETHASONE SODIUM PHOSPHATE 10 MG/ML IJ SOLN
INTRAMUSCULAR | Status: DC | PRN
  Administered 2018-07-01: 10 mg via INTRAVENOUS

## 2018-07-01 MED ORDER — LACTATED RINGERS IV SOLN
INTRAVENOUS | Status: DC
  Administered 2018-07-01: 09:00:00 via INTRAVENOUS

## 2018-07-01 MED ORDER — ROPIVACAINE HCL 5 MG/ML IJ SOLN
INTRAMUSCULAR | Status: DC | PRN
  Administered 2018-07-01: 30 mL via PERINEURAL

## 2018-07-01 MED ORDER — SCOPOLAMINE 1 MG/3DAYS TD PT72
MEDICATED_PATCH | TRANSDERMAL | Status: AC
  Filled 2018-07-01: qty 1

## 2018-07-01 MED ORDER — SENNA 8.6 MG PO TABS: 2.0000 | ORAL_TABLET | Freq: Two times a day (BID) | ORAL | 0 refills | Status: AC

## 2018-07-01 MED ORDER — FENTANYL CITRATE (PF) 100 MCG/2ML IJ SOLN
50.0000 ug | INTRAMUSCULAR | Status: DC | PRN
  Administered 2018-07-01: 25 ug via INTRAVENOUS
  Administered 2018-07-01: 50 ug via INTRAVENOUS

## 2018-07-01 MED ORDER — PROPOFOL 500 MG/50ML IV EMUL
INTRAVENOUS | Status: DC | PRN
  Administered 2018-07-01: 125 ug/kg/min via INTRAVENOUS

## 2018-07-01 MED ORDER — 0.9 % SODIUM CHLORIDE (POUR BTL) OPTIME
TOPICAL | Status: DC | PRN
  Administered 2018-07-01: 120 mL

## 2018-07-01 MED ORDER — ACETAMINOPHEN 500 MG PO TABS
1000.0000 mg | ORAL_TABLET | Freq: Once | ORAL | Status: AC
  Administered 2018-07-01: 1000 mg via ORAL

## 2018-07-01 MED ORDER — PROPOFOL 10 MG/ML IV BOLUS
INTRAVENOUS | Status: DC | PRN
  Administered 2018-07-01: 150 mg via INTRAVENOUS

## 2018-07-01 MED ORDER — MIDAZOLAM HCL 2 MG/2ML IJ SOLN
1.0000 mg | INTRAMUSCULAR | Status: DC | PRN
  Administered 2018-07-01: 1 mg via INTRAVENOUS

## 2018-07-01 SURGICAL SUPPLY — 70 items
BANDAGE ESMARK 6X9 LF (GAUZE/BANDAGES/DRESSINGS) IMPLANT
BLADE AVERAGE 25MMX9MM (BLADE)
BLADE AVERAGE 25X9 (BLADE) IMPLANT
BLADE LONG MED 25X9 (BLADE) ×2 IMPLANT
BLADE LONG MED 25X9MM (BLADE) ×1
BLADE SURG 15 STRL LF DISP TIS (BLADE) ×2 IMPLANT
BLADE SURG 15 STRL SS (BLADE) ×6
BNDG CMPR 9X4 STRL LF SNTH (GAUZE/BANDAGES/DRESSINGS)
BNDG CMPR 9X6 STRL LF SNTH (GAUZE/BANDAGES/DRESSINGS)
BNDG COHESIVE 4X5 TAN STRL (GAUZE/BANDAGES/DRESSINGS) ×3 IMPLANT
BNDG CONFORM 2 STRL LF (GAUZE/BANDAGES/DRESSINGS) ×3 IMPLANT
BNDG CONFORM 3 STRL LF (GAUZE/BANDAGES/DRESSINGS) ×1 IMPLANT
BNDG ESMARK 4X9 LF (GAUZE/BANDAGES/DRESSINGS) IMPLANT
BNDG ESMARK 6X9 LF (GAUZE/BANDAGES/DRESSINGS)
CHLORAPREP W/TINT 26ML (MISCELLANEOUS) ×3 IMPLANT
CORD BIPOLAR FORCEPS 12FT (ELECTRODE) IMPLANT
COVER BACK TABLE 60X90IN (DRAPES) ×3 IMPLANT
COVER WAND RF STERILE (DRAPES) IMPLANT
CUFF TOURNIQUET SINGLE 18IN (TOURNIQUET CUFF) IMPLANT
CUFF TOURNIQUET SINGLE 24IN (TOURNIQUET CUFF) ×2 IMPLANT
CUFF TOURNIQUET SINGLE 34IN LL (TOURNIQUET CUFF) IMPLANT
DRAPE EXTREMITY T 121X128X90 (DISPOSABLE) ×3 IMPLANT
DRAPE OEC MINIVIEW 54X84 (DRAPES) ×3 IMPLANT
DRAPE U-SHAPE 47X51 STRL (DRAPES) ×3 IMPLANT
DRSG MEPITEL 4X7.2 (GAUZE/BANDAGES/DRESSINGS) ×3 IMPLANT
DRSG PAD ABDOMINAL 8X10 ST (GAUZE/BANDAGES/DRESSINGS) ×2 IMPLANT
ELECT REM PT RETURN 9FT ADLT (ELECTROSURGICAL) ×3
ELECTRODE REM PT RTRN 9FT ADLT (ELECTROSURGICAL) ×1 IMPLANT
GAUZE SPONGE 4X4 12PLY STRL (GAUZE/BANDAGES/DRESSINGS) ×3 IMPLANT
GLOVE BIO SURGEON STRL SZ 6.5 (GLOVE) ×1 IMPLANT
GLOVE BIO SURGEON STRL SZ8 (GLOVE) ×3 IMPLANT
GLOVE BIO SURGEONS STRL SZ 6.5 (GLOVE) ×1
GLOVE BIOGEL PI IND STRL 7.0 (GLOVE) IMPLANT
GLOVE BIOGEL PI IND STRL 8 (GLOVE) ×2 IMPLANT
GLOVE BIOGEL PI INDICATOR 7.0 (GLOVE) ×4
GLOVE BIOGEL PI INDICATOR 8 (GLOVE) ×4
GLOVE ECLIPSE 8.0 STRL XLNG CF (GLOVE) ×3 IMPLANT
GOWN STRL REUS W/ TWL LRG LVL3 (GOWN DISPOSABLE) ×1 IMPLANT
GOWN STRL REUS W/ TWL XL LVL3 (GOWN DISPOSABLE) ×2 IMPLANT
GOWN STRL REUS W/TWL LRG LVL3 (GOWN DISPOSABLE) ×3
GOWN STRL REUS W/TWL XL LVL3 (GOWN DISPOSABLE) ×6
NDL HYPO 25X1 1.5 SAFETY (NEEDLE) IMPLANT
NEEDLE HYPO 22GX1.5 SAFETY (NEEDLE) IMPLANT
NEEDLE HYPO 25X1 1.5 SAFETY (NEEDLE) IMPLANT
NS IRRIG 1000ML POUR BTL (IV SOLUTION) ×3 IMPLANT
PACK BASIN DAY SURGERY FS (CUSTOM PROCEDURE TRAY) ×3 IMPLANT
PAD CAST 4YDX4 CTTN HI CHSV (CAST SUPPLIES) ×1 IMPLANT
PADDING CAST COTTON 4X4 STRL (CAST SUPPLIES) ×3
PENCIL BUTTON HOLSTER BLD 10FT (ELECTRODE) ×3 IMPLANT
SANITIZER HAND PURELL 535ML FO (MISCELLANEOUS) ×3 IMPLANT
SCREW HCS TWIST-OFF 2.0X12MM (Screw) ×4 IMPLANT
SHEET MEDIUM DRAPE 40X70 STRL (DRAPES) ×3 IMPLANT
SLEEVE SCD COMPRESS KNEE MED (MISCELLANEOUS) ×3 IMPLANT
SPONGE LAP 18X18 RF (DISPOSABLE) ×3 IMPLANT
STOCKINETTE 6  STRL (DRAPES) ×2
STOCKINETTE 6 STRL (DRAPES) ×1 IMPLANT
SUCTION FRAZIER HANDLE 10FR (MISCELLANEOUS) ×4
SUCTION TUBE FRAZIER 10FR DISP (MISCELLANEOUS) ×1 IMPLANT
SUT ETHILON 3 0 PS 1 (SUTURE) ×3 IMPLANT
SUT MNCRL AB 3-0 PS2 18 (SUTURE) ×3 IMPLANT
SUT VIC AB 2-0 SH 27 (SUTURE)
SUT VIC AB 2-0 SH 27XBRD (SUTURE) IMPLANT
SUT VICRYL 0 UR6 27IN ABS (SUTURE) ×2 IMPLANT
SYR BULB 3OZ (MISCELLANEOUS) ×3 IMPLANT
SYR CONTROL 10ML LL (SYRINGE) IMPLANT
TOWEL GREEN STERILE FF (TOWEL DISPOSABLE) ×3 IMPLANT
TUBE CONNECTING 20'X1/4 (TUBING) ×1
TUBE CONNECTING 20X1/4 (TUBING) ×1 IMPLANT
UNDERPAD 30X30 (UNDERPADS AND DIAPERS) ×3 IMPLANT
YANKAUER SUCT BULB TIP NO VENT (SUCTIONS) IMPLANT

## 2018-07-01 NOTE — Op Note (Signed)
07/01/2018  11:17 AM  PATIENT:  Penny Bennett  58 y.o. female  PRE-OPERATIVE DIAGNOSIS: 1.  Right foot second and third webspace Morton's neuroma      2.  Right forefoot metatarsalgia      3.  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  Excision of right second webspace Morton's neuroma 2.  Right second metatarsal Weil osteotomy 3.  Repair of right second MTP joint lateral collateral ligament 4.  Neuro lysis of right third webspace interdigital nerve 5.  Right third metatarsal Weil osteotomy 6.  Repair of right third MTP joint lateral collateral ligament 7.  Right foot AP and lateral radiographs  SURGEON:  Toni Arthurs, MD  ASSISTANT: Alfredo Martinez, PA-C  ANESTHESIA:   General, regional  EBL:  minimal   TOURNIQUET:   Total Tourniquet Time Documented: Thigh (Right) - 44 minutes Total: Thigh (Right) - 44 minutes  COMPLICATIONS:  None apparent  DISPOSITION:  Extubated, awake and stable to recovery.  INDICATION FOR PROCEDURE: The patient is a 58 year old female without significant past medical history.  She is developed pain at the right forefoot due to Morton's neuroma of the second and third webspace as well as metatarsalgia and insufficiency of the lateral collateral ligament of the second and third MTP joints.  She has failed nonoperative treatment to date including activity modification, oral anti-inflammatories, bracing and shoewear modification.  She presents now for operative treatment of these painful conditions.  The risks and benefits of the alternative treatment options have been discussed in detail.  The patient wishes to proceed with surgery and specifically understands risks of bleeding, infection, nerve damage, blood clots, need for additional surgery, amputation and death.  PROCEDURE IN DETAIL:  After pre operative consent was obtained, and the correct operative site  was identified, the patient was brought to the operating room and placed supine on the OR table.  Anesthesia was administered.  Pre-operative antibiotics were administered.  A surgical timeout was taken.  The right lower extremity was prepped and draped in standard sterile fashion with a tourniquet around the thigh.  The extremity was elevated and the tourniquet was inflated to 250 mmHg.  A longitudinal incision was made over the second webspace.  Dissection was carried down through the subcutaneous tissues.  The intermetatarsal ligament was divided under direct vision.  The interdigital nerve was identified.  It was dissected free from the surrounding soft tissues and vascular structures.  It was transected proximally at the level of the interossei.  It was then traced distally past the bifurcation and transected at the second and third toes.  Attention was then turned to the second metatarsal.  The dorsal joint capsule was incised exposing the metatarsal head.  A Weil osteotomy was made with the oscillating saw all removing a small wedge of bone.  The head of the metatarsal was allowed to retract proximally a couple of millimeters.  The osteotomy was fixed with a 2 mm Biomet FRS screw.  The collateral ligament was inspected and was noted to be patulous.  There was a small tear noted at the most lateral aspect of the plantar plate that was incomplete.  A box suture of 0 Vicryl was placed in the collateral ligament and tagged for later tying.  Attention was then turned to the third webspace.  A dorsal incision was made and dissection was carried down through the subcutaneous tissues.  The intermetatarsal ligament  was divided.  The intermetatarsal nerve was identified.  Careful neural lysis was performed along the nerve freeing it of all adherent soft tissue and excising fibrotic tissue.  The third metatarsal head was exposed.  A Weil osteotomy was made as described above and again fixed with a 2 mm FRS screw.   There was no evidence of plantar plate tear, but the lateral collateral ligament was again noted to be patulous.  A box suture was placed in the lateral collateral ligament and tied reapproximating the MTP joint appropriately.  The suture placed for the second MTP joint was also tied.  Final AP and lateral radiographs confirmed appropriate shortening of the second and third metatarsals in appropriate position and length of both screws.  The wounds were irrigated copiously.  The subcutaneous tissues were approximated with 3-0 Monocryl.  The skin incision was closed with horizontal mattress sutures of 3-0 nylon.  Sterile dressings were applied followed by a compression wrap.  The tourniquet was released after application of the dressings.  The patient was awakened from anesthesia and transported to the recovery room in stable condition.   FOLLOW UP PLAN: Weightbearing as tolerated in a flat postop shoe.  Follow-up in the office in 2 weeks for suture removal.  She will also need a silicone toe spacer at the first webspace.   RADIOGRAPHS: AP and lateral radiographs of the right foot were obtained intraoperatively.  These show interval shortening of the second and third metatarsals with appropriately positioned hardware.  No other acute injuries are noted.    Alfredo Martinez PA-C was present and scrubbed for the duration of the operative case. His assistance was essential in positioning the patient, prepping and draping, gaining and maintaining exposure, performing the operation, closing and dressing the wounds and applying the splint.

## 2018-07-01 NOTE — Discharge Instructions (Addendum)
Toni Arthurs, MD [facility]  Please read the following information regarding your care after surgery.  Medications  You only need a prescription for the narcotic pain medicine (ex. oxycodone, Percocet, Norco).  All of the other medicines listed below are available over the counter. X Aleve 2 pills twice a day for the first 3 days after surgery. X acetominophen (Tylenol) 650 mg every 4-6 hours as you need for minor to moderate pain X oxycodone as prescribed for severe pain  Narcotic pain medicine (ex. oxycodone, Percocet, Vicodin) will cause constipation.  To prevent this problem, take the following medicines while you are taking any pain medicine. X docusate sodium (Colace) 100 mg twice a day X senna (Senokot) 2 tablets twice a day  Weight Bearing X Bear weight only on your operated foot in the post-op shoe.   Cast / Splint / Dressing X Keep your splint, cast or dressing clean and dry.  Dont put anything (coat hanger, pencil, etc) down inside of it.  If it gets damp, use a hair dryer on the cool setting to dry it.  If it gets soaked, call the office to schedule an appointment for a cast change.   After your dressing, cast or splint is removed; you may shower, but do not soak or scrub the wound.  Allow the water to run over it, and then gently pat it dry.  Swelling It is normal for you to have swelling where you had surgery.  To reduce swelling and pain, keep your toes above your nose for at least 3 days after surgery.  It may be necessary to keep your foot or leg elevated for several weeks.  If it hurts, it should be elevated.  Follow Up Call my office at [phone] when you are discharged from the hospital or surgery center to schedule an appointment to be seen two weeks after surgery.  Call my office at [phone] if you develop a fever >101.5 F, nausea, vomiting, bleeding from the surgical site or severe pain.       Regional Anesthesia Blocks  1. Numbness or  the inability to move the "blocked" extremity may last from 3-48 hours after placement. The length of time depends on the medication injected and your individual response to the medication. If the numbness is not going away after 48 hours, call your surgeon.  2. The extremity that is blocked will need to be protected until the numbness is gone and the  Strength has returned. Because you cannot feel it, you will need to take extra care to avoid injury. Because it may be weak, you may have difficulty moving it or using it. You may not know what position it is in without looking at it while the block is in effect.  3. For blocks in the legs and feet, returning to weight bearing and walking needs to be done carefully. You will need to wait until the numbness is entirely gone and the strength has returned. You should be able to move your leg and foot normally before you try and bear weight or walk. You will need someone to be with you when you first try to ensure you do not fall and possibly risk injury.  4. Bruising and tenderness at the needle site are common side effects and will resolve in a few days.  5. Persistent numbness or new problems with movement should be communicated to the surgeon or the [facility] [phone] [facility] [phone]).  Post Anesthesia Home Care Instructions  NO tylenol until after 3:00pm 07/01/2018.  Activity: Get plenty of rest for the remainder of the day. A responsible individual must stay with you for 24 hours following the procedure.  For the next 24 hours, DO NOT: -Drive a car -Advertising copywriter -Drink alcoholic beverages -Take any medication unless instructed by your physician -Make any legal decisions or sign important papers.  Meals: Start with liquid foods such as gelatin or soup. Progress to regular foods as tolerated. Avoid greasy, spicy, heavy foods. If nausea and/or vomiting occur, drink only clear liquids  until the nausea and/or vomiting subsides. Call your physician if vomiting continues.  Special Instructions/Symptoms: Your throat may feel dry or sore from the anesthesia or the breathing tube placed in your throat during surgery. If this causes discomfort, gargle with warm salt water. The discomfort should disappear within 24 hours.  If you had a scopolamine patch placed behind your ear for the management of post- operative nausea and/or vomiting:  1. The medication in the patch is effective for 72 hours, after which it should be removed.  Wrap patch in a tissue and discard in the trash. Wash hands thoroughly with soap and water. 2. You may remove the patch earlier than 72 hours if you experience unpleasant side effects which may include dry mouth, dizziness or visual disturbances. 3. Avoid touching the patch. Wash your hands with soap and water after contact with the patch.

## 2018-07-01 NOTE — Progress Notes (Signed)
Assisted Dr. Houser with right, ultrasound guided, popliteal block. Side rails up, monitors on throughout procedure. See vital signs in flow sheet. Tolerated Procedure well. 

## 2018-07-01 NOTE — H&P (Signed)
Penny Bennett is an 58 y.o. female.   Chief Complaint: Right foot pain HPI: The patient is a 58 year old female without significant past medical history.  She has a history of right forefoot pain due to metatarsalgia and Morton's neuroma symptoms.  She has failed nonoperative treatment to date including activity modification, oral anti-inflammatories, bracing and shoewear modifications.  She presents now for operative treatment of these painful forefoot deformities.  Past Medical History:  Diagnosis Date  . Complication of anesthesia   . Meniere's disease   . PONV (postoperative nausea and vomiting)     Past Surgical History:  Procedure Laterality Date  . ABLATION ON ENDOMETRIOSIS    . ENDOMETRIAL BIOPSY    . KNEE ARTHROSCOPY Right   . RECTO-VAGINAL FISSURE REPAIR    . WISDOM TOOTH EXTRACTION      History reviewed. No pertinent family history. Social History:  reports that she quit smoking about 23 years ago. She has never used smokeless tobacco. She reports current alcohol use. She reports that she does not use drugs.  Allergies:  Allergies  Allergen Reactions  . Sulfa Antibiotics Rash    Medications Prior to Admission  Medication Sig Dispense Refill  . phentermine (ADIPEX-P) 37.5 MG tablet TAKE 1 TABLET BY MOUTH DAILY IN THE MORNING  3  . zolpidem (AMBIEN) 5 MG tablet Take 5 mg by mouth at bedtime as needed for sleep.      No results found for this or any previous visit (from the past 48 hour(s)). No results found.  ROS no recent fever, chills, nausea, vomiting or changes in her appetite  Blood pressure 125/73, pulse (!) 53, temperature 98.4 F (36.9 C), temperature source Oral, resp. rate 16, height 5\' 5"  (1.651 m), weight 62.9 kg, last menstrual period 02/06/2015, SpO2 100 %. Physical Exam  Well-nourished well-developed woman in no apparent distress.  Alert and oriented x4.  Mood and affect are normal.  Extraocular motions are intact.  Respirations are unlabored.  Gait  is normal.  The right foot second and third toes deviate towards the hallux.  Tender to palpation at the second and third webspaces.  Skin is otherwise healthy and intact.  No lymphadenopathy.  5 out of 5 strength in plantar flexion and dorsiflexion of the ankle and toes.  No lymphadenopathy.  Assessment/Plan Right second and third webspace Morton's neuroma, metatarsalgia and lateral collateral ligament insufficiency  -to the operating room for Weil osteotomies, Morton's neuroma excision and neuro lysis and collateral ligament repairs.  The risks and benefits of the alternative treatment options have been discussed in detail.  The patient wishes to proceed with surgery and specifically understands risks of bleeding, infection, nerve damage, blood clots, need for additional surgery, amputation and death.   Toni Arthurs, MD 2018/07/20, 9:42 AM

## 2018-07-01 NOTE — Anesthesia Procedure Notes (Signed)
Anesthesia Regional Block: Popliteal block   Pre-Anesthetic Checklist: ,, timeout performed, Correct Patient, Correct Site, Correct Laterality, Correct Procedure, Correct Position, site marked, Risks and benefits discussed, pre-op evaluation,  At surgeon's request and post-op pain management  Laterality: Right  Prep: Maximum Sterile Barrier Precautions used, chloraprep       Needles:  Injection technique: Single-shot  Needle Type: Echogenic Needle     Needle Length: 9cm  Needle Gauge: 21     Additional Needles:   Procedures:,,,, ultrasound used (permanent image in chart),,,,  Narrative:  Start time: 07/01/2018 9:45 AM End time: 07/01/2018 9:52 AM Injection made incrementally with aspirations every 5 mL.  Performed by: Personally  Anesthesiologist: Trevor Iha, MD  Additional Notes: Block assessed. Patient tolerated procedure well.

## 2018-07-01 NOTE — Anesthesia Procedure Notes (Signed)
Procedure Name: LMA Insertion Date/Time: 07/01/2018 10:06 AM Performed by: Sheryn Bison, CRNA Pre-anesthesia Checklist: Patient identified, Emergency Drugs available, Suction available and Patient being monitored Patient Re-evaluated:Patient Re-evaluated prior to induction Oxygen Delivery Method: Circle system utilized Preoxygenation: Pre-oxygenation with 100% oxygen Induction Type: IV induction Ventilation: Mask ventilation without difficulty LMA: LMA inserted LMA Size: 4.0 Number of attempts: 1 Airway Equipment and Method: Bite block Placement Confirmation: positive ETCO2 Tube secured with: Tape Dental Injury: Teeth and Oropharynx as per pre-operative assessment

## 2018-07-01 NOTE — Anesthesia Postprocedure Evaluation (Signed)
Anesthesia Post Note  Patient: Penny Bennett  Procedure(s) Performed: Right Second/Third Metatarsal Weil Osteotomy and Collateral Ligament Repairs (Right Foot) Right Second Morton's Neuroma Excision; Third Neurolysis (Right Foot)     Patient location during evaluation: PACU Anesthesia Type: Regional and General Level of consciousness: awake and alert Pain management: pain level controlled Vital Signs Assessment: post-procedure vital signs reviewed and stable Respiratory status: spontaneous breathing, nonlabored ventilation, respiratory function stable and patient connected to nasal cannula oxygen Cardiovascular status: blood pressure returned to baseline and stable Postop Assessment: no apparent nausea or vomiting Anesthetic complications: no    Last Vitals:  Vitals:   07/01/18 1200 07/01/18 1215  BP: 112/73 132/86  Pulse: (!) 52 61  Resp: 15 18  Temp:  36.6 C  SpO2: 99% 100%    Last Pain:  Vitals:   07/01/18 1215  TempSrc:   PainSc: 0-No pain                 Trevor Iha

## 2018-07-01 NOTE — Transfer of Care (Signed)
Immediate Anesthesia Transfer of Care Note  Patient: Penny Bennett  Procedure(s) Performed: Right Second/Third Metatarsal Weil Osteotomy and Collateral Ligament Repairs (Right Foot) Right Second Morton's Neuroma Excision; Third Neurolysis (Right Foot)  Patient Location: PACU  Anesthesia Type:GA combined with regional for post-op pain  Level of Consciousness: awake, alert , oriented and patient cooperative  Airway & Oxygen Therapy: Patient Spontanous Breathing and Patient connected to face mask oxygen  Post-op Assessment: Report given to RN and Post -op Vital signs reviewed and stable  Post vital signs: Reviewed and stable  Last Vitals:  Vitals Value Taken Time  BP    Temp    Pulse 79 07/01/2018 11:10 AM  Resp    SpO2 100 % 07/01/2018 11:10 AM  Vitals shown include unvalidated device data.  Last Pain:  Vitals:   07/01/18 0838  TempSrc: Oral  PainSc: 0-No pain      Patients Stated Pain Goal: 0 (07/01/18 1638)  Complications: No apparent anesthesia complications

## 2018-07-02 ENCOUNTER — Encounter (HOSPITAL_BASED_OUTPATIENT_CLINIC_OR_DEPARTMENT_OTHER): Payer: Self-pay | Admitting: Orthopedic Surgery

## 2019-03-29 ENCOUNTER — Other Ambulatory Visit: Payer: Self-pay

## 2019-03-29 DIAGNOSIS — Z20822 Contact with and (suspected) exposure to covid-19: Secondary | ICD-10-CM

## 2019-03-31 LAB — NOVEL CORONAVIRUS, NAA: SARS-CoV-2, NAA: NOT DETECTED

## 2021-03-22 ENCOUNTER — Ambulatory Visit: Payer: 59 | Admitting: Family Medicine

## 2021-12-02 LAB — EXTERNAL GENERIC LAB PROCEDURE: COLOGUARD: NEGATIVE

## 2021-12-02 LAB — COLOGUARD: COLOGUARD: NEGATIVE

## 2023-04-29 ENCOUNTER — Other Ambulatory Visit: Payer: Self-pay | Admitting: Obstetrics and Gynecology

## 2023-04-29 DIAGNOSIS — R928 Other abnormal and inconclusive findings on diagnostic imaging of breast: Secondary | ICD-10-CM

## 2023-05-09 ENCOUNTER — Ambulatory Visit
Admission: RE | Admit: 2023-05-09 | Discharge: 2023-05-09 | Disposition: A | Discharge status: Home / Self Care | Payer: 59 | Source: Ambulatory Visit | Attending: Obstetrics and Gynecology | Admitting: Obstetrics and Gynecology

## 2023-05-09 ENCOUNTER — Ambulatory Visit: Payer: 59

## 2023-05-09 DIAGNOSIS — R928 Other abnormal and inconclusive findings on diagnostic imaging of breast: Secondary | ICD-10-CM

## 2023-05-11 ENCOUNTER — Other Ambulatory Visit: Payer: Self-pay | Admitting: Obstetrics and Gynecology

## 2023-05-11 DIAGNOSIS — R921 Mammographic calcification found on diagnostic imaging of breast: Secondary | ICD-10-CM

## 2023-11-13 ENCOUNTER — Ambulatory Visit
Admission: RE | Admit: 2023-11-13 | Discharge: 2023-11-13 | Disposition: A | Discharge status: Home / Self Care | Source: Ambulatory Visit | Attending: Obstetrics and Gynecology | Admitting: Obstetrics and Gynecology

## 2023-11-13 DIAGNOSIS — R921 Mammographic calcification found on diagnostic imaging of breast: Secondary | ICD-10-CM

## 2024-06-08 ENCOUNTER — Other Ambulatory Visit: Payer: Self-pay | Admitting: Obstetrics and Gynecology

## 2024-06-08 DIAGNOSIS — R921 Mammographic calcification found on diagnostic imaging of breast: Secondary | ICD-10-CM

## 2024-06-28 ENCOUNTER — Encounter

## 2024-07-07 ENCOUNTER — Encounter

## 2024-07-08 ENCOUNTER — Encounter
